# Patient Record
Sex: Female | Born: 2000 | Hispanic: Yes | Marital: Single | Smoking: Never smoker
Health system: Southern US, Community
[De-identification: ages and names within clinical notes are randomized; demographics above are authoritative.]

## PROBLEM LIST (undated history)

## (undated) ENCOUNTER — Ambulatory Visit

---
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
## 2022-10-07 ENCOUNTER — Encounter: Payer: Self-pay | Admitting: *Deleted

## 2023-05-04 DIAGNOSIS — F321 Major depressive disorder, single episode, moderate: Secondary | ICD-10-CM | POA: Diagnosis not present

## 2023-05-04 DIAGNOSIS — F411 Generalized anxiety disorder: Secondary | ICD-10-CM | POA: Diagnosis not present

## 2023-05-04 DIAGNOSIS — F900 Attention-deficit hyperactivity disorder, predominantly inattentive type: Secondary | ICD-10-CM | POA: Diagnosis not present

## 2023-06-01 DIAGNOSIS — F900 Attention-deficit hyperactivity disorder, predominantly inattentive type: Secondary | ICD-10-CM | POA: Diagnosis not present

## 2023-06-03 DIAGNOSIS — F902 Attention-deficit hyperactivity disorder, combined type: Secondary | ICD-10-CM | POA: Diagnosis not present

## 2023-06-08 DIAGNOSIS — F321 Major depressive disorder, single episode, moderate: Secondary | ICD-10-CM | POA: Diagnosis not present

## 2023-06-08 DIAGNOSIS — F411 Generalized anxiety disorder: Secondary | ICD-10-CM | POA: Diagnosis not present

## 2023-06-08 DIAGNOSIS — F902 Attention-deficit hyperactivity disorder, combined type: Secondary | ICD-10-CM | POA: Diagnosis not present

## 2023-08-04 DIAGNOSIS — F321 Major depressive disorder, single episode, moderate: Secondary | ICD-10-CM | POA: Diagnosis not present

## 2023-08-04 DIAGNOSIS — F411 Generalized anxiety disorder: Secondary | ICD-10-CM | POA: Diagnosis not present

## 2023-08-04 DIAGNOSIS — F900 Attention-deficit hyperactivity disorder, predominantly inattentive type: Secondary | ICD-10-CM | POA: Diagnosis not present

## 2023-09-09 DIAGNOSIS — Z111 Encounter for screening for respiratory tuberculosis: Secondary | ICD-10-CM | POA: Diagnosis not present

## 2023-09-09 DIAGNOSIS — Z0184 Encounter for antibody response examination: Secondary | ICD-10-CM | POA: Diagnosis not present

## 2023-09-15 DIAGNOSIS — F900 Attention-deficit hyperactivity disorder, predominantly inattentive type: Secondary | ICD-10-CM | POA: Diagnosis not present

## 2023-09-15 DIAGNOSIS — F321 Major depressive disorder, single episode, moderate: Secondary | ICD-10-CM | POA: Diagnosis not present

## 2023-09-15 DIAGNOSIS — F411 Generalized anxiety disorder: Secondary | ICD-10-CM | POA: Diagnosis not present

## 2023-10-14 ENCOUNTER — Encounter: Payer: Self-pay | Admitting: *Deleted
